# Patient Record
Sex: Female | Born: 1980 | Hispanic: Yes | Marital: Single | Smoking: Never smoker
Health system: Southern US, Community
[De-identification: ages and names within clinical notes are randomized; demographics above are authoritative.]

## PROBLEM LIST (undated history)

## (undated) DIAGNOSIS — Z789 Other specified health status: Secondary | ICD-10-CM

## (undated) HISTORY — PX: NO PAST SURGERIES: SHX2092

---

## 2005-01-28 ENCOUNTER — Inpatient Hospital Stay: Payer: Self-pay | Admitting: Obstetrics and Gynecology

## 2007-06-13 ENCOUNTER — Ambulatory Visit: Payer: Self-pay | Admitting: Certified Nurse Midwife

## 2008-09-02 ENCOUNTER — Inpatient Hospital Stay: Payer: Self-pay

## 2011-12-14 ENCOUNTER — Ambulatory Visit: Payer: Self-pay | Admitting: Family Medicine

## 2012-07-12 LAB — OB RESULTS CONSOLE RUBELLA ANTIBODY, IGM
RUBELLA: IMMUNE
Rubella: IMMUNE

## 2012-07-18 ENCOUNTER — Ambulatory Visit: Payer: Self-pay | Admitting: Primary Care

## 2012-08-14 ENCOUNTER — Emergency Department: Payer: Self-pay | Admitting: Internal Medicine

## 2012-11-27 ENCOUNTER — Inpatient Hospital Stay: Payer: Self-pay

## 2012-11-27 LAB — CBC WITH DIFFERENTIAL/PLATELET
Basophil %: 0.3 %
Eosinophil %: 0.3 %
HCT: 37.6 % (ref 35.0–47.0)
HGB: 12.9 g/dL (ref 12.0–16.0)
Lymphocyte #: 2.2 10*3/uL (ref 1.0–3.6)
MCHC: 34.2 g/dL (ref 32.0–36.0)
MCV: 85 fL (ref 80–100)
Neutrophil %: 71.6 %
Platelet: 252 10*3/uL (ref 150–440)
WBC: 10.6 10*3/uL (ref 3.6–11.0)

## 2012-11-28 LAB — HEMOGLOBIN: HGB: 12.1 g/dL (ref 12.0–16.0)

## 2013-03-23 IMAGING — US US OB US >=[ID] SNGL FETUS
1 series · 13 of 28 positions shown · non-contrast
Comparison: none

REASON FOR EXAM: anatomy  placenta location
COMMENTS:

[Series 1: us ob us >=(id) sngl fetus · 0.28mm/px · 13 of 57 slices shown]
[im 3/57]
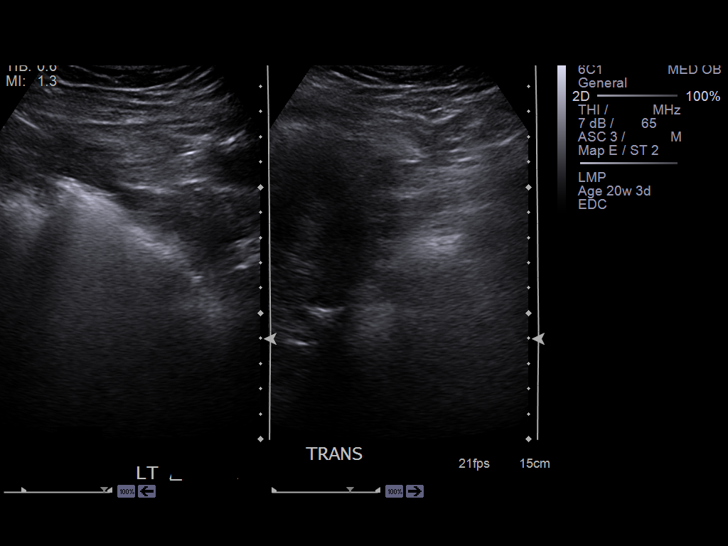
[im 7/57]
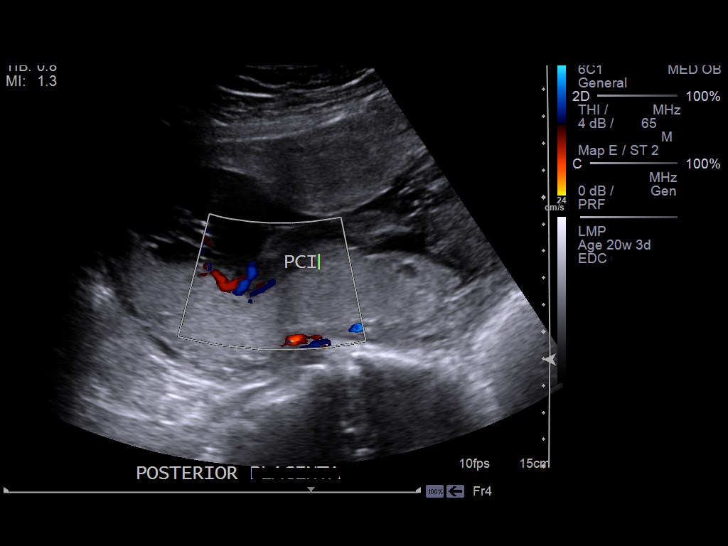
[im 11/57]
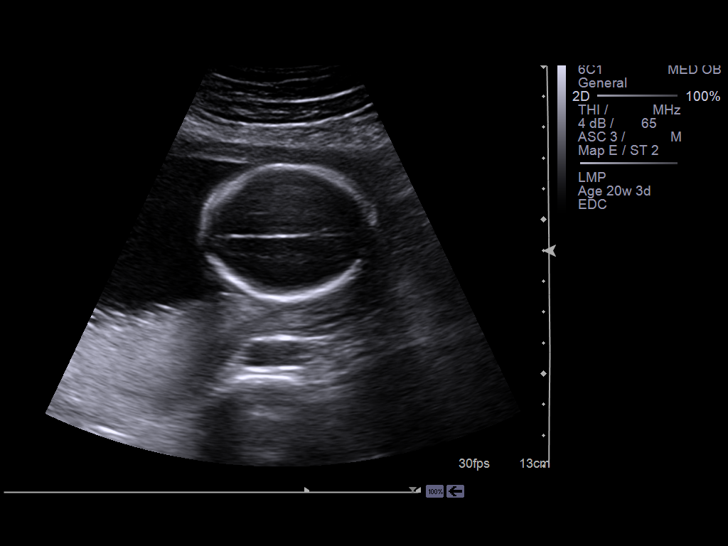
[im 15/57]
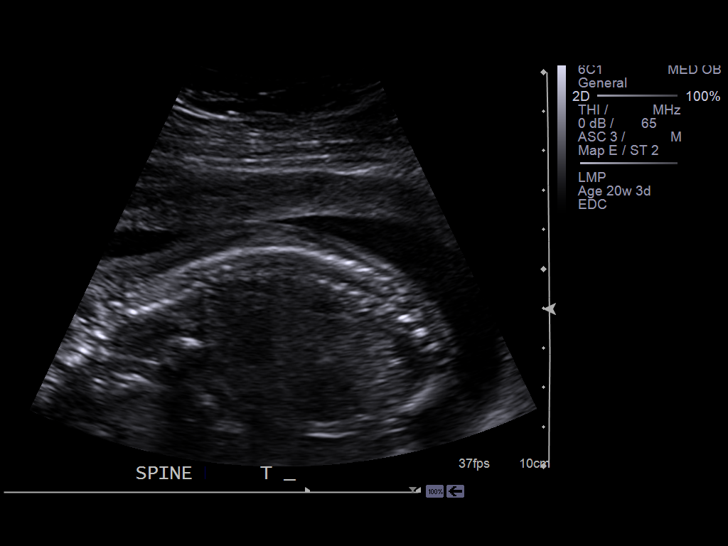
[im 19/57]
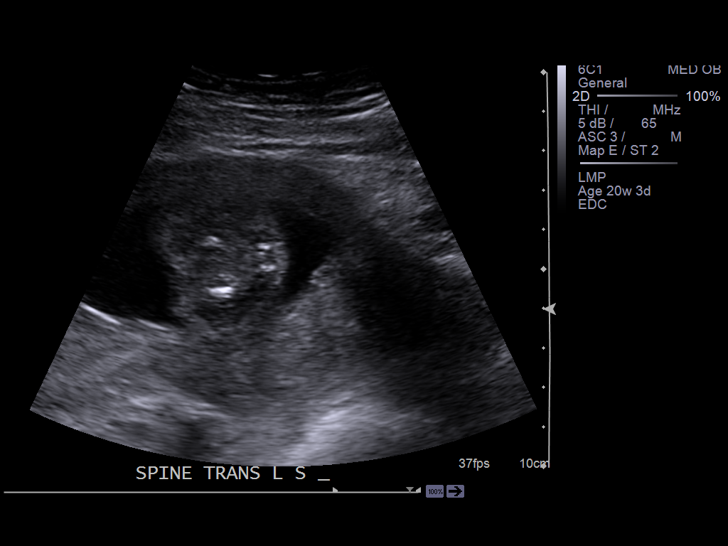
[im 23/57]
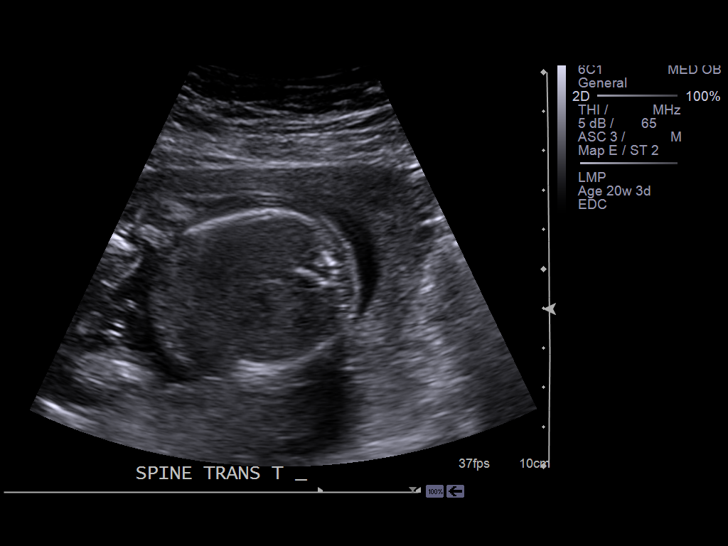
[im 30/57]
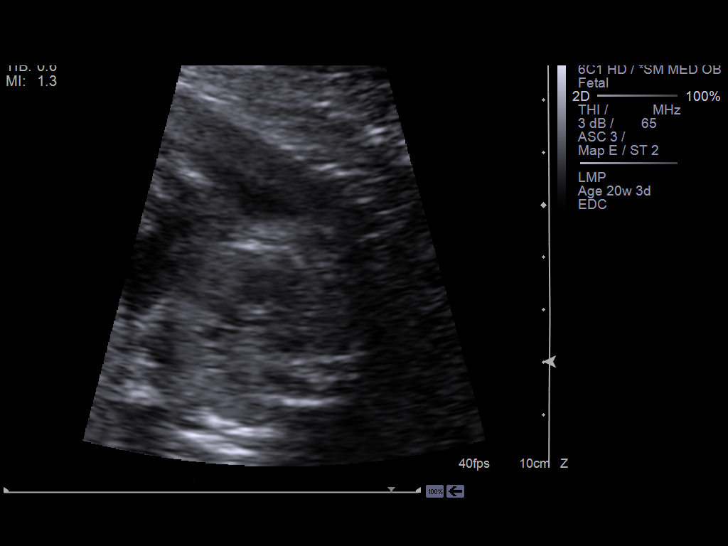
[im 34/57]
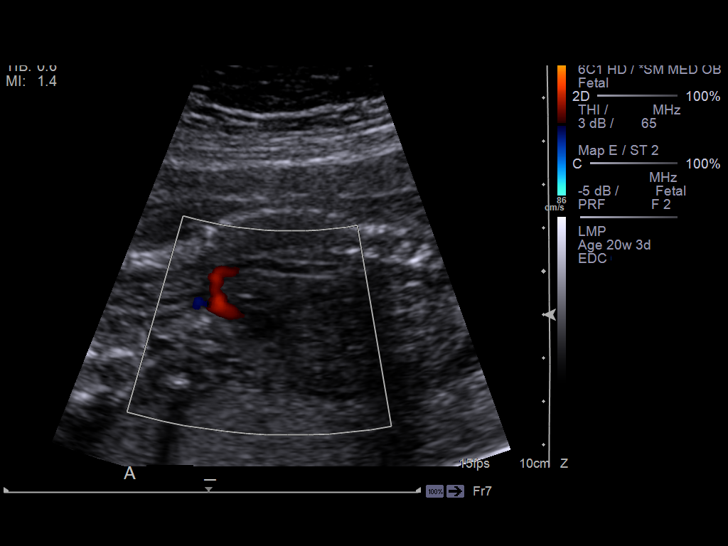
[im 38/57]
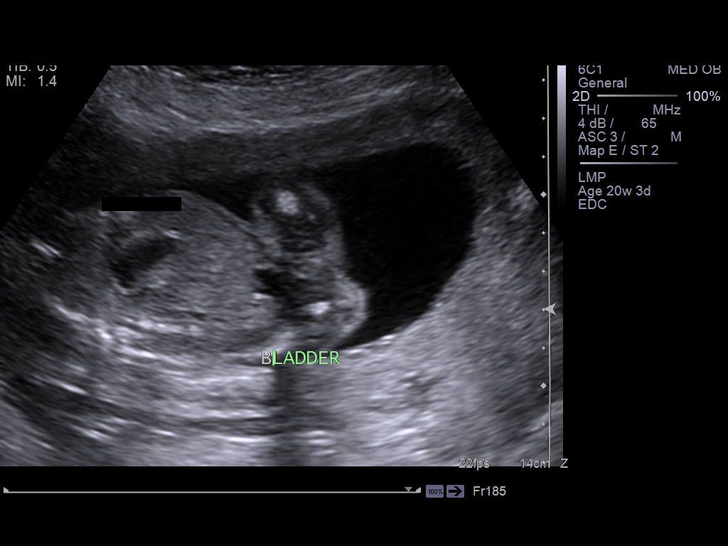
[im 42/57]
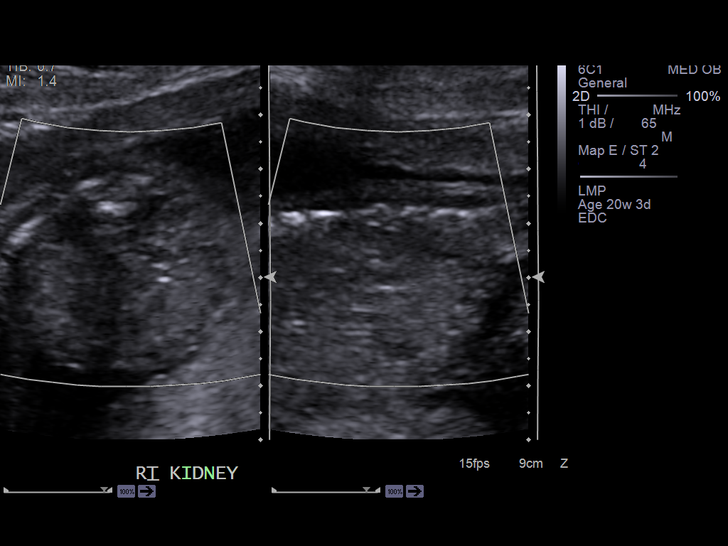
[im 46/57]
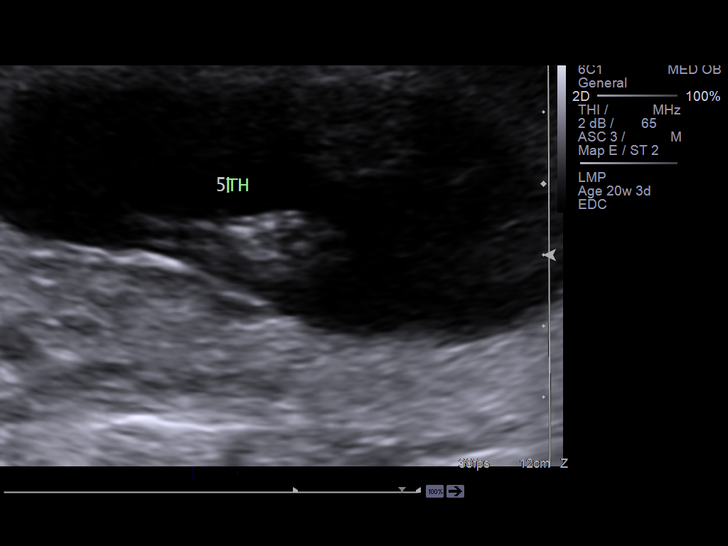
[im 50/57]
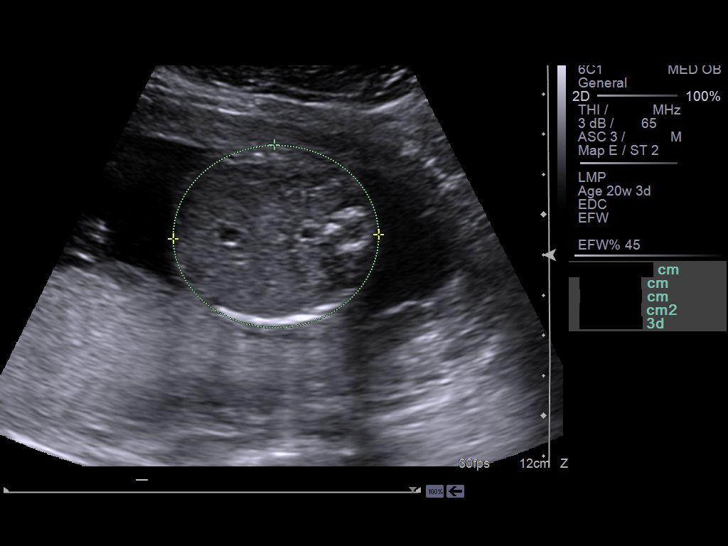
[im 54/57]
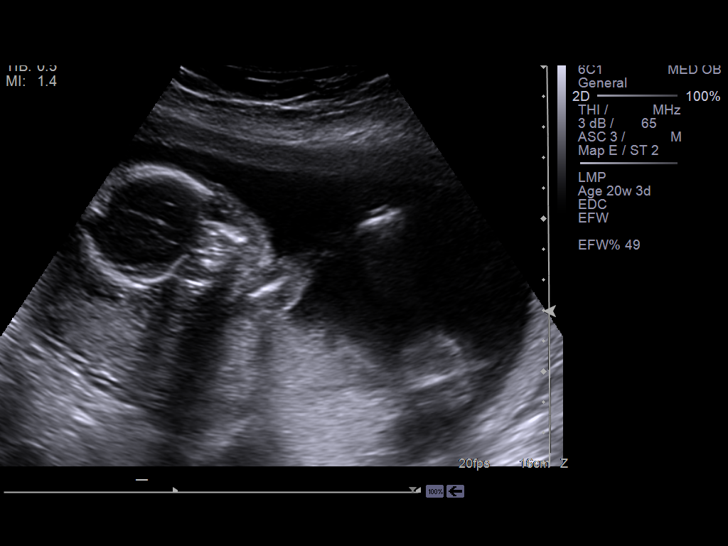

[13 of 28 positions shown; findings below may reference images not displayed]

PROCEDURE:     US  - US OB GREATER/OR EQUAL TO QCYT5  - July 18, 2012 [DATE]

RESULT:     There is a gravid uterus present. The presentation of the single
fetus is breech. The amniotic fluid volume is estimated to be normal. The
placenta is posterior, partially fundal, and the inferior margin of the
placenta is 4.6 cm from the internal cervical os.

The intracranial structures, the craniocervical junction, and the spinal
structures are normal in appearance. A 4 chambered heart with a rate of 165
beats per minute was demonstrated. The fetal stomach, kidneys, and urinary
bladder were demonstrated and appeared normal.

Measured parameters:
BPD 4.48 cm corresponding to an EGA of 19 weeks 4 days
HC 17.6 cm corresponding to an EGA of 20 weeks 0 days
AC. 15.05 cm corresponding to an EGA of 20 weeks 2 days
FL 3.47 cm corresponding to an EGA of 21 weeks 0 days
HL 3.36 cm corresponding to an EGA of 21 weeks 3 days
IMPRESSION: 1. There is a viable IUP with estimated gestational age of 20 weeks 3 days
+ / - 12 days. The estimated date of confinement is December 02, 2012. This is in
reasonable agreement with the patient's clinical dating. The estimated fetal
weight is 358 grams + / - 53 grams.
2. No fetal anomalies are demonstrated.
3. The placenta is posterior and partially fundal with no evidence of
placenta previa.

[REDACTED]

## 2016-03-09 ENCOUNTER — Other Ambulatory Visit: Payer: Self-pay | Admitting: Primary Care

## 2016-03-09 DIAGNOSIS — R102 Pelvic and perineal pain: Secondary | ICD-10-CM

## 2016-03-16 ENCOUNTER — Ambulatory Visit
Admission: RE | Admit: 2016-03-16 | Discharge: 2016-03-16 | Disposition: A | Discharge status: Home / Self Care | Payer: PRIVATE HEALTH INSURANCE | Source: Ambulatory Visit | Attending: Primary Care | Admitting: Primary Care

## 2016-03-16 ENCOUNTER — Ambulatory Visit (HOSPITAL_COMMUNITY): Payer: PRIVATE HEALTH INSURANCE

## 2016-03-16 DIAGNOSIS — R102 Pelvic and perineal pain: Secondary | ICD-10-CM

## 2016-03-17 ENCOUNTER — Ambulatory Visit: Admission: RE | Admit: 2016-03-17 | Payer: PRIVATE HEALTH INSURANCE | Source: Ambulatory Visit

## 2016-06-20 NOTE — L&D Delivery Note (Signed)
Delivery Note At 9:15 AM a  Viable female was delivered via  (Presentation: OA;  ). spont vag delivery, No CAN, Vtx, Ant and post shoulder and body del at   APGAR: , ; weight  .   Placenta status:Intact..  Cord:  with the following complications: None  Anesthesia: Epidural  Episiotomy:  None Lacerations: None  Suture Repair: none Est. Blood Loss (mL):  200 ml  Mom to .  Baby mom's abd. mom's abd. Sharee Pimple 02/15/2017, 9:26 AM

## 2016-09-22 ENCOUNTER — Other Ambulatory Visit: Payer: Self-pay | Admitting: Primary Care

## 2016-09-22 DIAGNOSIS — Z3482 Encounter for supervision of other normal pregnancy, second trimester: Secondary | ICD-10-CM

## 2016-09-23 LAB — OB RESULTS CONSOLE HIV ANTIBODY (ROUTINE TESTING): HIV: NONREACTIVE

## 2016-09-23 LAB — OB RESULTS CONSOLE HEPATITIS B SURFACE ANTIGEN: Hepatitis B Surface Ag: NEGATIVE

## 2016-09-23 LAB — OB RESULTS CONSOLE RPR: RPR: NONREACTIVE

## 2016-09-23 LAB — OB RESULTS CONSOLE VARICELLA ZOSTER ANTIBODY, IGG: Varicella: IMMUNE

## 2016-09-24 LAB — OB RESULTS CONSOLE GC/CHLAMYDIA
CHLAMYDIA, DNA PROBE: NEGATIVE
GC PROBE AMP, GENITAL: NEGATIVE

## 2016-09-29 ENCOUNTER — Ambulatory Visit
Admission: RE | Admit: 2016-09-29 | Discharge: 2016-09-29 | Disposition: A | Discharge status: Home / Self Care | Payer: PRIVATE HEALTH INSURANCE | Source: Ambulatory Visit | Attending: Primary Care | Admitting: Primary Care

## 2016-09-29 DIAGNOSIS — Z3482 Encounter for supervision of other normal pregnancy, second trimester: Secondary | ICD-10-CM | POA: Insufficient documentation

## 2016-11-01 ENCOUNTER — Other Ambulatory Visit: Payer: Self-pay | Admitting: Primary Care

## 2016-11-01 DIAGNOSIS — Z3482 Encounter for supervision of other normal pregnancy, second trimester: Secondary | ICD-10-CM

## 2017-01-19 LAB — OB RESULTS CONSOLE GBS: STREP GROUP B AG: NEGATIVE

## 2017-02-15 ENCOUNTER — Inpatient Hospital Stay
Admission: EM | Admit: 2017-02-15 | Discharge: 2017-02-16 | DRG: 775 | Disposition: A | Discharge status: Home / Self Care | Payer: Medicaid Other | Attending: Obstetrics and Gynecology | Admitting: Obstetrics and Gynecology

## 2017-02-15 ENCOUNTER — Inpatient Hospital Stay: Payer: Medicaid Other | Admitting: Registered Nurse

## 2017-02-15 DIAGNOSIS — Z3493 Encounter for supervision of normal pregnancy, unspecified, third trimester: Secondary | ICD-10-CM | POA: Diagnosis present

## 2017-02-15 DIAGNOSIS — Z3A39 39 weeks gestation of pregnancy: Secondary | ICD-10-CM | POA: Diagnosis not present

## 2017-02-15 DIAGNOSIS — Z3490 Encounter for supervision of normal pregnancy, unspecified, unspecified trimester: Secondary | ICD-10-CM

## 2017-02-15 DIAGNOSIS — M79661 Pain in right lower leg: Secondary | ICD-10-CM

## 2017-02-15 HISTORY — DX: Other specified health status: Z78.9

## 2017-02-15 LAB — TYPE AND SCREEN
ABO/RH(D): O POS
ANTIBODY SCREEN: NEGATIVE

## 2017-02-15 LAB — CBC
HCT: 39.9 % (ref 35.0–47.0)
Hemoglobin: 13.5 g/dL (ref 12.0–16.0)
MCH: 29.5 pg (ref 26.0–34.0)
MCHC: 33.7 g/dL (ref 32.0–36.0)
MCV: 87.6 fL (ref 80.0–100.0)
PLATELETS: 234 10*3/uL (ref 150–440)
RBC: 4.55 MIL/uL (ref 3.80–5.20)
RDW: 14.4 % (ref 11.5–14.5)
WBC: 11.5 10*3/uL — AB (ref 3.6–11.0)

## 2017-02-15 MED ORDER — ONDANSETRON HCL 4 MG PO TABS: 4.0000 mg | ORAL_TABLET | ORAL | Status: DC | PRN

## 2017-02-15 MED ORDER — LIDOCAINE HCL (PF) 2 % IJ SOLN
INTRAMUSCULAR | Status: DC | PRN
  Administered 2017-02-15: 100 mg via EPIDURAL

## 2017-02-15 MED ORDER — SODIUM CHLORIDE 0.9% FLUSH: 3.0000 mL | INTRAVENOUS | Status: DC | PRN

## 2017-02-15 MED ORDER — LACTATED RINGERS IV SOLN
INTRAVENOUS | Status: DC
  Administered 2017-02-15 (×2): via INTRAVENOUS

## 2017-02-15 MED ORDER — BUTORPHANOL TARTRATE 2 MG/ML IJ SOLN
1.0000 mg | INTRAMUSCULAR | Status: DC | PRN
  Administered 2017-02-15: 1 mg via INTRAVENOUS

## 2017-02-15 MED ORDER — OXYTOCIN 40 UNITS IN LACTATED RINGERS INFUSION - SIMPLE MED
INTRAVENOUS | Status: AC
  Administered 2017-02-15: 09:00:00
  Filled 2017-02-15: qty 1000

## 2017-02-15 MED ORDER — BUTORPHANOL TARTRATE 2 MG/ML IJ SOLN
INTRAMUSCULAR | Status: AC
  Administered 2017-02-15: 1 mg via INTRAVENOUS
  Filled 2017-02-15: qty 1

## 2017-02-15 MED ORDER — ACETAMINOPHEN 325 MG PO TABS
650.0000 mg | ORAL_TABLET | ORAL | Status: DC | PRN
  Administered 2017-02-15: 650 mg via ORAL
  Filled 2017-02-15: qty 2

## 2017-02-15 MED ORDER — SODIUM CHLORIDE 0.9% FLUSH: 3.0000 mL | Freq: Two times a day (BID) | INTRAVENOUS | Status: DC

## 2017-02-15 MED ORDER — LIDOCAINE HCL (PF) 1 % IJ SOLN
INTRAMUSCULAR | Status: AC
  Filled 2017-02-15: qty 30

## 2017-02-15 MED ORDER — TETANUS-DIPHTH-ACELL PERTUSSIS 5-2.5-18.5 LF-MCG/0.5 IM SUSP: 0.5000 mL | Freq: Once | INTRAMUSCULAR | Status: DC

## 2017-02-15 MED ORDER — BUTORPHANOL TARTRATE 2 MG/ML IJ SOLN: 1.0000 mg | INTRAMUSCULAR | Status: DC

## 2017-02-15 MED ORDER — BISACODYL 10 MG RE SUPP: 10.0000 mg | Freq: Every day | RECTAL | Status: DC | PRN

## 2017-02-15 MED ORDER — DIPHENHYDRAMINE HCL 25 MG PO CAPS: 25.0000 mg | ORAL_CAPSULE | Freq: Four times a day (QID) | ORAL | Status: DC | PRN

## 2017-02-15 MED ORDER — BUPIVACAINE HCL (PF) 0.25 % IJ SOLN
INTRAMUSCULAR | Status: DC | PRN
  Administered 2017-02-15: 4 mL via EPIDURAL

## 2017-02-15 MED ORDER — FLEET ENEMA 7-19 GM/118ML RE ENEM: 1.0000 | ENEMA | Freq: Every day | RECTAL | Status: DC | PRN

## 2017-02-15 MED ORDER — FENTANYL 2.5 MCG/ML W/ROPIVACAINE 0.15% IN NS 100 ML EPIDURAL (ARMC)
EPIDURAL | Status: AC
  Filled 2017-02-15: qty 100

## 2017-02-15 MED ORDER — ONDANSETRON HCL 4 MG/2ML IJ SOLN: 4.0000 mg | INTRAMUSCULAR | Status: DC | PRN

## 2017-02-15 MED ORDER — LACTATED RINGERS IV SOLN
500.0000 mL | INTRAVENOUS | Status: DC | PRN
  Administered 2017-02-15: 1000 mL via INTRAVENOUS

## 2017-02-15 MED ORDER — DIBUCAINE 1 % RE OINT: 1.0000 "application " | TOPICAL_OINTMENT | RECTAL | Status: DC | PRN

## 2017-02-15 MED ORDER — IBUPROFEN 600 MG PO TABS
600.0000 mg | ORAL_TABLET | Freq: Four times a day (QID) | ORAL | Status: DC
Start: 2017-02-15 — End: 2017-02-16
  Administered 2017-02-15 – 2017-02-16 (×5): 600 mg via ORAL
  Filled 2017-02-15 (×5): qty 1

## 2017-02-15 MED ORDER — COCONUT OIL OIL: 1.0000 "application " | TOPICAL_OIL | Status: DC | PRN

## 2017-02-15 MED ORDER — AMMONIA AROMATIC IN INHA
RESPIRATORY_TRACT | Status: AC
  Filled 2017-02-15: qty 10

## 2017-02-15 MED ORDER — BENZOCAINE-MENTHOL 20-0.5 % EX AERO: 1.0000 "application " | INHALATION_SPRAY | CUTANEOUS | Status: DC | PRN

## 2017-02-15 MED ORDER — SODIUM CHLORIDE 0.9 % IV SOLN: 250.0000 mL | INTRAVENOUS | Status: DC | PRN

## 2017-02-15 MED ORDER — LIDOCAINE HCL (PF) 1 % IJ SOLN
INTRAMUSCULAR | Status: DC | PRN
  Administered 2017-02-15: 3 mL via SUBCUTANEOUS

## 2017-02-15 MED ORDER — LIDOCAINE-EPINEPHRINE (PF) 1.5 %-1:200000 IJ SOLN
INTRAMUSCULAR | Status: DC | PRN
  Administered 2017-02-15: 3 mL via EPIDURAL

## 2017-02-15 MED ORDER — HYDROCODONE-ACETAMINOPHEN 5-325 MG PO TABS
1.0000 | ORAL_TABLET | ORAL | Status: DC | PRN
  Administered 2017-02-15: 2 via ORAL
  Administered 2017-02-16: 1 via ORAL
  Administered 2017-02-16: 2 via ORAL
  Administered 2017-02-16: 1 via ORAL
  Filled 2017-02-15 (×2): qty 1
  Filled 2017-02-15 (×2): qty 2

## 2017-02-15 MED ORDER — OXYTOCIN 10 UNIT/ML IJ SOLN
INTRAMUSCULAR | Status: AC
  Filled 2017-02-15: qty 2

## 2017-02-15 MED ORDER — MISOPROSTOL 200 MCG PO TABS
ORAL_TABLET | ORAL | Status: AC
  Filled 2017-02-15: qty 4

## 2017-02-15 MED ORDER — SENNOSIDES-DOCUSATE SODIUM 8.6-50 MG PO TABS
2.0000 | ORAL_TABLET | ORAL | Status: DC
  Administered 2017-02-15: 2 via ORAL
  Filled 2017-02-15: qty 2

## 2017-02-15 MED ORDER — ZOLPIDEM TARTRATE 5 MG PO TABS: 5.0000 mg | ORAL_TABLET | Freq: Every evening | ORAL | Status: DC | PRN

## 2017-02-15 MED ORDER — MEASLES, MUMPS & RUBELLA VAC ~~LOC~~ INJ
0.5000 mL | INJECTION | Freq: Once | SUBCUTANEOUS | Status: DC
  Filled 2017-02-15: qty 0.5

## 2017-02-15 MED ORDER — SIMETHICONE 80 MG PO CHEW
80.0000 mg | CHEWABLE_TABLET | ORAL | Status: DC | PRN
Start: 2017-02-15 — End: 2017-02-16

## 2017-02-15 MED ORDER — PRENATAL MULTIVITAMIN CH
1.0000 | ORAL_TABLET | Freq: Every day | ORAL | Status: DC
  Administered 2017-02-15 – 2017-02-16 (×2): 1 via ORAL
  Filled 2017-02-15 (×3): qty 1

## 2017-02-15 MED ORDER — FENTANYL 2.5 MCG/ML W/ROPIVACAINE 0.15% IN NS 100 ML EPIDURAL (ARMC)
EPIDURAL | Status: DC | PRN
  Administered 2017-02-15: 12 mL/h via EPIDURAL

## 2017-02-15 MED ORDER — WITCH HAZEL-GLYCERIN EX PADS: 1.0000 "application " | MEDICATED_PAD | CUTANEOUS | Status: DC | PRN

## 2017-02-15 NOTE — Anesthesia Preprocedure Evaluation (Signed)
Anesthesia Evaluation  Patient identified by MRN, date of birth, ID band Patient awake    Reviewed: Allergy & Precautions, H&P , NPO status , Patient's Chart, lab work & pertinent test results  Airway Mallampati: IV  TM Distance: >3 FB Neck ROM: full    Dental  (+) Teeth Intact, Dental Advidsory Given   Pulmonary neg pulmonary ROS,           Cardiovascular Exercise Tolerance: Good negative cardio ROS       Neuro/Psych negative neurological ROS  negative psych ROS   GI/Hepatic negative GI ROS, Neg liver ROS,   Endo/Other  negative endocrine ROS  Renal/GU negative Renal ROS  negative genitourinary   Musculoskeletal   Abdominal   Peds  Hematology negative hematology ROS (+)   Anesthesia Other Findings   Reproductive/Obstetrics (+) Pregnancy                             Anesthesia Physical Anesthesia Plan  ASA: II  Anesthesia Plan: Epidural   Post-op Pain Management:    Induction:   PONV Risk Score and Plan:   Airway Management Planned:   Additional Equipment:   Intra-op Plan:   Post-operative Plan:   Informed Consent: I have reviewed the patients History and Physical, chart, labs and discussed the procedure including the risks, benefits and alternatives for the proposed anesthesia with the patient or authorized representative who has indicated his/her understanding and acceptance.     Plan Discussed with: Anesthesiologist and CRNA  Anesthesia Plan Comments:         Anesthesia Quick Evaluation

## 2017-02-15 NOTE — OB Triage Note (Signed)
Patient came in for observation for labor evaluation. Patient reports contractions every four to five minutes that started last night around 2200. Patient denies leaking of fluid but reports vaginal bleeding and spotting when patient went to the bathroom around 0500. Vital signs stable and patient afebrile. FHR baseline 155 with moderate variability with accelerations 15 x 15 and no decelerations. Significant other at bedside. Will continue to monitor.

## 2017-02-15 NOTE — Discharge Instructions (Signed)
Care After Vaginal Delivery °Congratulations on your new baby!! ° °Refer to this sheet in the next few weeks. These discharge instructions provide you with information on caring for yourself after delivery. Your caregiver may also give you specific instructions. Your treatment has been planned according to the most current medical practices available, but problems sometimes occur. Call your caregiver if you have any problems or questions after you go home. ° °HOME CARE INSTRUCTIONS °· Take over-the-counter or prescription medicines only as directed by your caregiver or pharmacist. °· Do not drink alcohol, especially if you are breastfeeding or taking medicine to relieve pain. °· Do not chew or smoke tobacco. °· Do not use illegal drugs. °· Continue to use good perineal care. Good perineal care includes: °¨ Wiping your perineum from front to back. °¨ Keeping your perineum clean. °· Do not use tampons or douche until your caregiver says it is okay. °· Shower, wash your hair, and take tub baths as directed by your caregiver. °· Wear a well-fitting bra that provides breast support. °· Eat healthy foods. °· Drink enough fluids to keep your urine clear or pale yellow. °· Eat high-fiber foods such as whole grain cereals and breads, brown rice, beans, and fresh fruits and vegetables every day. These foods may help prevent or relieve constipation. °· Follow your caregiver's recommendations regarding resumption of activities such as climbing stairs, driving, lifting, exercising, or traveling. Specifically, no driving for two weeks, so that you are comfortable reacting quickly in an emergency. °· Talk to your caregiver about resuming sexual activities. Resumption of sexual activities is dependent upon your risk of infection, your rate of healing, and your comfort and desire to resume sexual activity. Usually we recommend waiting about six weeks, or until your bleeding stops and you are interested in sex. °· Try to have someone  help you with your household activities and your newborn for at least a few days after you leave the hospital. Even longer is better. °· Rest as much as possible. Try to rest or take a nap when your newborn is sleeping. Sleep deprivation can be very hard after delivery. °· Increase your activities gradually. °· Keep all of your scheduled postpartum appointments. It is very important to keep your scheduled follow-up appointments. At these appointments, your caregiver will be checking to make sure that you are healing physically and emotionally. ° °SEEK MEDICAL CARE IF:  °· You are passing large clots from your vagina.  °· You have a foul smelling discharge from your vagina. °· You have trouble urinating. °· You are urinating frequently. °· You have pain when you urinate. °· You have a change in your bowel movements. °· You have increasing redness, pain, or swelling near your vaginal incision (episiotomy) or vaginal tear. °· You have pus draining from your episiotomy or vaginal tear. °· Your episiotomy or vaginal tear is separating. °· You have painful, hard, or reddened breasts. °· You have a severe headache. °· You have blurred vision or see spots. °· You feel sad or depressed. °· You have thoughts of hurting yourself or your newborn. °· You have questions about your care, the care of your newborn, or medicines. °· You are dizzy or light-headed. °· You have a rash. °· You have nausea or vomiting. °· You were breastfeeding and have not had a menstrual period within 12 weeks after you stopped breastfeeding. °· You are not breastfeeding and have not had a menstrual period by the 12th week after delivery. °· You   have a fever. ° °SEEK IMMEDIATE MEDICAL CARE IF:  °· You have persistent pain. °· You have chest pain. °· You have shortness of breath. °· You faint. °· You have leg pain. °· You have stomach pain. °· Your vaginal bleeding saturates two or more sanitary pads in 1 hour. ° °MAKE SURE YOU:  °· Understand these  instructions. °· Will get help right away if you are not doing well or get worse. °·  °Document Released: 06/03/2000 Document Revised: 10/21/2013 Document Reviewed: 02/01/2012 ° °ExitCare® Patient Information ©2015 [employer]. This information is not intended to replace advice given to you by your health care provider. Make sure you discuss any questions you have with your health care provider. ° °

## 2017-02-15 NOTE — H&P (Signed)
Tara Fletcher is a 36 y.o. female presenting for active labor pattern since last pm. Pt goes to [facility] and is a [id number] who has had 4 NSVD's in the past. Pt has an LMP of 07/04/16 & EDD of 02/22/17 and [place] at 24 2/7 weeks with EDD of 02/21/17. GBs neg. Pt admitted to [facility] for delivery. NO ROM, no VB or decreased FM Today. Pt is hurting and epidural just performed prior to my arrival. Report from Dr Bernestine Amass.  OB History    Gravida Para Term Preterm AB Living   2 1 1     1    SAB TAB Ectopic Multiple Live Births           1     OEV:OJJK pain, lower back pain, HPylori, Varicella non-immune,  Past Surgical History:  Procedure Laterality Date  . NO PAST SURGERIES    FMH:Dad:HTN, MGM:DM Social History   Social History  . Marital status: Single    Spouse name: N/A  . Number of children: N/A  . Years of education: N/A   Occupational History  . Not on file.   Social History Main Topics  . Smoking status: Never Smoker  . Smokeless tobacco: Never Used  . Alcohol use Not on file  . Drug use: Unknown  . Sexual activity: Not on file   Other Topics Concern  . Not on file   Social History Narrative  . No narrative on file  OB:GYN: [id number], 1 SAB, 4 NSVD 1. 04/20/98  40 wks  NSVD of viable female 8pounds in [place].  2. 07/21/01   40 wks   NSVD of viable F 9 pounds in [facility] 3. 01/28/05  37 wks? NSVD of viable F, 4 pounds in [facility] 4. 06/13/07  SAB at 6 weeks 5. 09/02/08  40 wks  NSVD of viable Female at 6 pounds at [facility]    Maternal Diabetes:137 Genetic Screening: declined Maternal Ultrasounds/Referrals:WNL Fetal Ultrasounds or other Referrals: N/A Maternal Substance Abuse:  None Significant Maternal Medications: PNV Significant Maternal Lab Results: Varicella non-immune, GBS neg  Other Comments:  ROS History Dilation: 9 Effacement (%): 90 Station: -2 Exam by:: LSE Blood pressure 130/73, pulse 79, temperature 98.1 F (36.7 C), temperature source Oral, height 5\' 2"  (1.575 m), weight  80.3 kg (177 lb), SpO2 98 %. Exam Physical Exam  Gen:35 yo Hispanic female with active labor HEENT:Eyes non-icteric, Normocephalic Lungs: CTA bilat, no W/R/R. Heart: S1S2, RRR, NO M/R/G KXF:GHWEXH, EFW 7#8oz See cx exam Extrems: Rt 2+/0 Prenatal labs: ABO, Rh:  O pos Antibody:  Neg Rubella:  Immune RPR:   NR HBsAg:   Neg HIV:   NR GBS:   Neg  Assessment/Plan: A:1. IUP at 39 weeks 2. AMA  3. Multip 4. GBS neg 5. Varicella non-immune P:1. Pt just received epidural. 2. Antic SVD. 3. Will give Varivax pp. 4. Plans PP IUD. 5. Plans to breast/bottle.   Sharee Pimple 02/15/2017, 8:11 AM

## 2017-02-15 NOTE — Discharge Summary (Addendum)
Obstetric Discharge Summary Reason for Admission: Term pregnancy in active labor  Prenatal Procedures: [place] Intrapartum Procedures: Ext fetal and uterine monitors  Postpartum Procedures: Doppler of right leg Complications-Operative and Postpartum: None - Homans sign positive on right leg postpartum day 1. Dopplers negative for DVT - Mild fundal tenderness in am PPD#1, resolved by evening prior to discharge Hemoglobin  Date Value Ref Range Status  02/16/2017 11.5 (L) 12.0 - 16.0 g/dL Final   HGB  Date Value Ref Range Status  11/28/2012 12.1 12.0 - 16.0 g/dL Final   HCT  Date Value Ref Range Status  02/16/2017 33.0 (L) 35.0 - 47.0 % Final  11/27/2012 37.6 35.0 - 47.0 % Final    Physical Exam:  General:a,A,&O x3 CV:S1S2, RRR, NO M/R/G Lungs:CTA bilat, no W/R/R Lochia:  Mod, no clots Uterine Fundus: FF,  Incision:None   Discharge Diagnoses: Term delivery with intact perineum   Discharge Information: Date: 02/16/2017 Activity: No driving x 2 weeks, No sex x 6 weeks Diet: Regular Medications: PNV, FE, Ibuprofen  Condition: Stable  Instructions: FU at 6 weeks pp at [facility] Discharge to: Home    Newborn Data: Live born female  Birth Weight: 7 lb 7.2 oz (3380 g) APGAR: 8, 9  Home with Mom.   Christeen Douglas 02/16/2017, 2:41 PM

## 2017-02-15 NOTE — Anesthesia Procedure Notes (Signed)
Epidural Patient location during procedure: OB Start time: 02/15/2017 7:52 AM End time: 02/15/2017 8:00 AM  Staffing Anesthesiologist: Naomie Dean Resident/CRNA: Karoline Caldwell Performed: resident/CRNA   Preanesthetic Checklist Completed: patient identified, site marked, surgical consent, pre-op evaluation, timeout performed, IV checked, risks and benefits discussed and monitors and equipment checked  Epidural Patient position: sitting Prep: Betadine Patient monitoring: heart rate, continuous pulse ox and blood pressure Approach: midline Location: L4-L5 Injection technique: LOR saline  Needle:  Needle type: Tuohy  Needle gauge: 17 G Needle length: 9 cm and 9 Needle insertion depth: 6 cm Catheter type: closed end flexible Catheter size: 19 Gauge Catheter at skin depth: 10 cm Test dose: negative and 1.5% lidocaine with Epi 1:200 K  Assessment Events: blood not aspirated, injection not painful, no injection resistance, negative IV test and no paresthesia  Additional Notes Pt. Evaluated and documentation done after procedure finished. Patient identified. Risks/Benefits/Options discussed with patient including but not limited to bleeding, infection, nerve damage, paralysis, failed block, incomplete pain control, headache, blood pressure changes, nausea, vomiting, reactions to medication both or allergic, itching and postpartum back pain. Confirmed with bedside nurse the patient's most recent platelet count. Confirmed with patient that they are not currently taking any anticoagulation, have any bleeding history or any family history of bleeding disorders. Patient expressed understanding and wished to proceed. All questions were answered. Sterile technique was used throughout the entire procedure. Please see nursing notes for vital signs. Test dose was given through epidural catheter and negative prior to continuing to dose epidural or start infusion. Warning signs of high block given  to the patient including shortness of breath, tingling/numbness in hands, complete motor block, or any concerning symptoms with instructions to call for help. Patient was given instructions on fall risk and not to get out of bed. All questions and concerns addressed with instructions to call with any issues or inadequate analgesia.   Patient tolerated the insertion well without immediate complications.Reason for block:procedure for pain

## 2017-02-16 ENCOUNTER — Inpatient Hospital Stay: Payer: Medicaid Other

## 2017-02-16 LAB — CBC
HEMATOCRIT: 33 % — AB (ref 35.0–47.0)
Hemoglobin: 11.5 g/dL — ABNORMAL LOW (ref 12.0–16.0)
MCH: 29.9 pg (ref 26.0–34.0)
MCHC: 34.7 g/dL (ref 32.0–36.0)
MCV: 86.2 fL (ref 80.0–100.0)
Platelets: 198 10*3/uL (ref 150–440)
RBC: 3.83 MIL/uL (ref 3.80–5.20)
RDW: 14.3 % (ref 11.5–14.5)
WBC: 10.5 10*3/uL (ref 3.6–11.0)

## 2017-02-16 LAB — RPR: RPR: NONREACTIVE

## 2017-02-16 MED ORDER — HYDROCODONE-ACETAMINOPHEN 5-325 MG PO TABS: 1.0000 | ORAL_TABLET | ORAL | 0 refills | Status: AC | PRN

## 2017-02-16 MED ORDER — IBUPROFEN 600 MG PO TABS: 600.0000 mg | ORAL_TABLET | Freq: Four times a day (QID) | ORAL | 0 refills | Status: AC

## 2017-02-16 NOTE — Progress Notes (Signed)
Post Partum Day 1 Subjective: Abdominal pain and right calf tenderness  Objective: Blood pressure (!) 99/57, pulse 64, temperature 98.5 F (36.9 C), temperature source Oral, resp. rate 18, height 5\' 2"  (1.575 m), weight 177 lb (80.3 kg), SpO2 99 %, unknown if currently breastfeeding.  Physical Exam:  General: alert, cooperative and appears stated age Lochia: appropriate Uterine Fundus: firm and tender DVT Evaluation: No cords or calf tenderness. No significant calf/ankle edema. Positive Homan's sign.   Recent Labs  02/15/17 0620 02/16/17 0504  HGB 13.5 11.5*  HCT 39.9 33.0*    Assessment/Plan: - right calf doppler - monitor fundal tenderness - encourage ambulation and lactation consult\ - repeat wbc if any other s/s endometritis    LOS: 1 day   Christeen DouglasBethany Marianne Golightly 02/16/2017, 9:19 AM

## 2017-02-16 NOTE — Progress Notes (Signed)
Discharge instructions reviewed with patient.  All questions answered.

## 2017-02-16 NOTE — Lactation Note (Signed)
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 Feeding Type: Breast Fed   Interventions    Lactation Tools Discussed/Used WIC Program: Yes   Consult Status      Trudee GripCarolyn P Halsey Hammen 02/16/2017, 12:40 PM

## 2017-02-16 NOTE — Progress Notes (Signed)
Face to face (0930am) with Dr.Beasley regarding positive Homans on right side during assessment.  Per Dr.Beasely will order Doppler to rule out DVT before sending pt home.

## 2017-02-16 NOTE — Anesthesia Postprocedure Evaluation (Signed)
Anesthesia Post Note  Patient: [name]  Procedure(s) Performed: * No procedures listed *  Patient location during evaluation: Mother Baby Anesthesia Type: Epidural Level of consciousness: awake and alert and oriented Pain management: satisfactory to patient Vital Signs Assessment: post-procedure vital signs reviewed and stable Respiratory status: respiratory function stable Cardiovascular status: stable Postop Assessment: no headache, no backache, epidural receding, no signs of nausea or vomiting, patient able to bend at knees and adequate PO intake Anesthetic complications: no     Last Vitals:  Vitals:   02/16/17 0028 02/16/17 0358  BP: 99/69 96/61  Pulse: (!) 58 64  Resp: 20 20  Temp: 36.6 C 36.7 C  SpO2: 99% 98%    Last Pain:  Vitals:   02/16/17 0500  TempSrc:   PainSc: 2                  [name], [name]

## 2017-02-16 NOTE — Progress Notes (Signed)
Paged Dr.Beasley to notify about US of lower right leg results.  No new orders at this time.  Dr.Beasley to discharge pt to home soon

## 2017-06-04 IMAGING — US US OB COMP +14 WK
1 of 2 series · 13 of 28 positions shown · non-contrast
Comparison: none

CLINICAL DATA: 35-year-old pregnant female presenting for fetal
dating and anatomic survey.

EXAM:
OBSTETRICAL ULTRASOUND >14 WKS

[Series 1: us ob comp +14 wk · 0.23mm/px · 119 acquisitions, 13 frames shown]
[im 5/119]
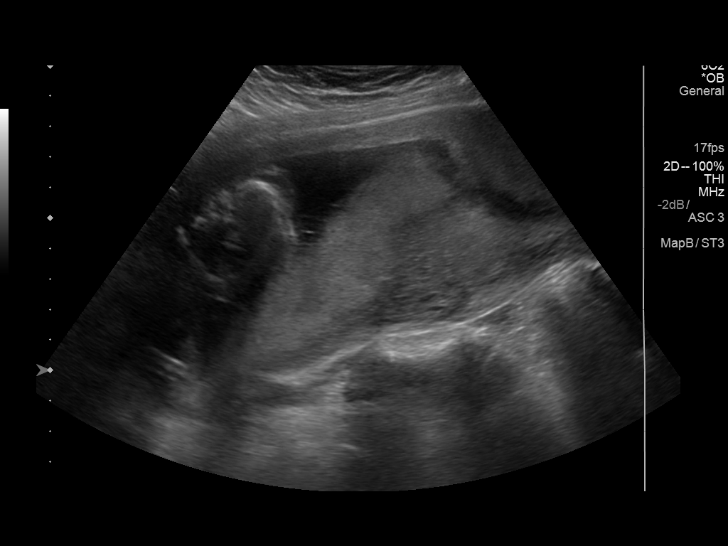
[im 14/119]
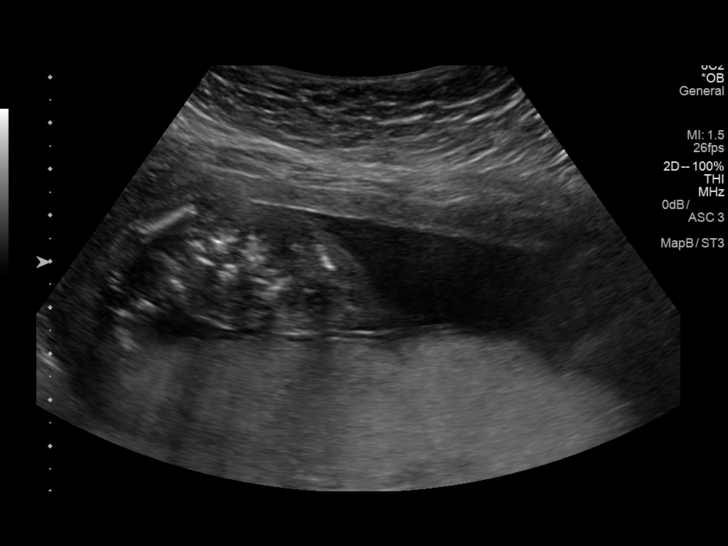
[im 23/119]
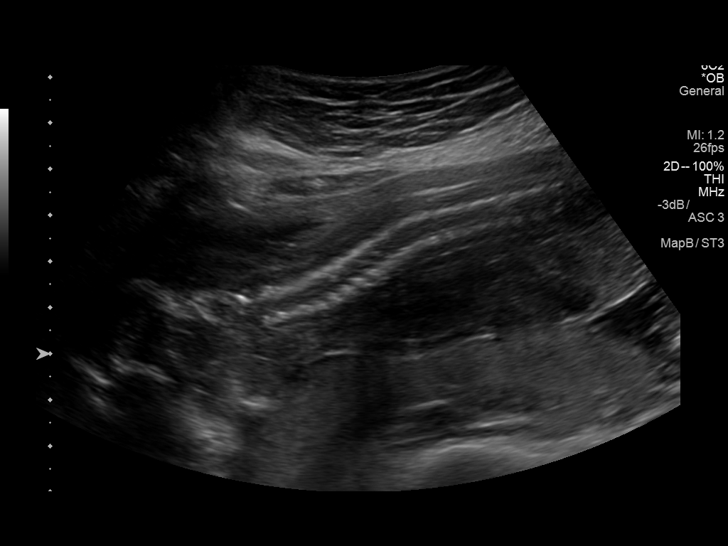
[im 32/119]
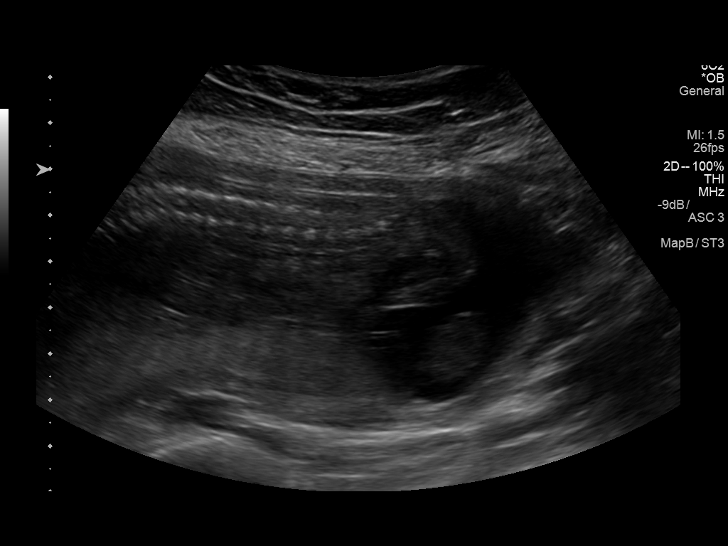
[im 41/119]
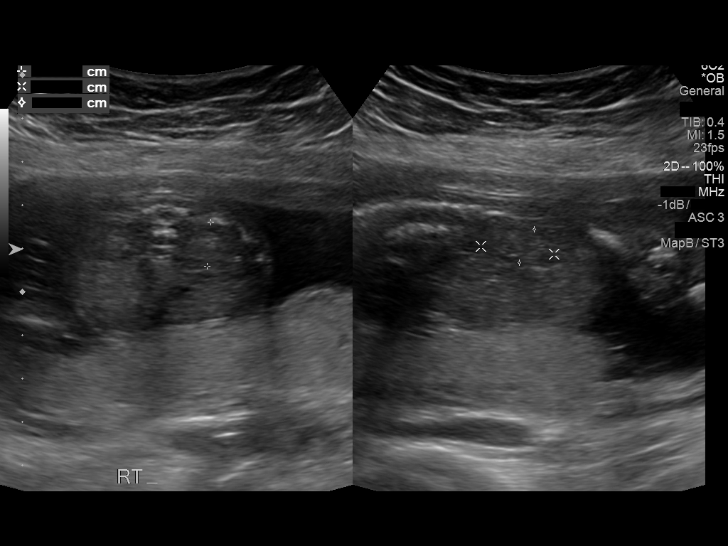
[im 50/119]
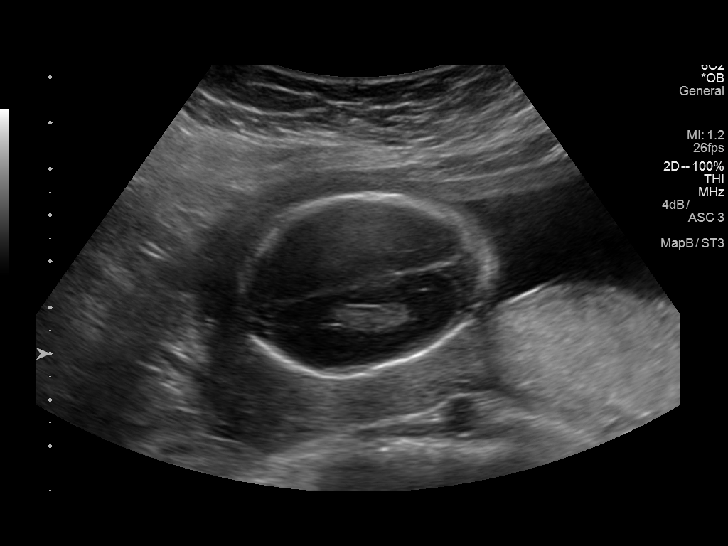
[im 64/119]
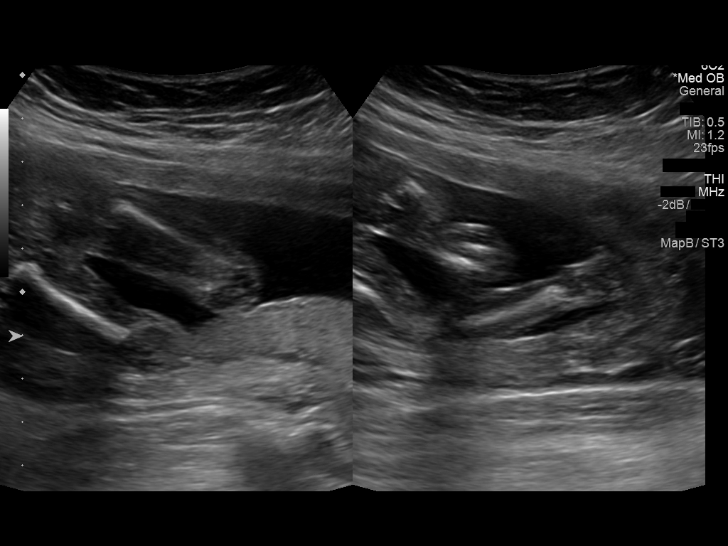
[im 73/119]
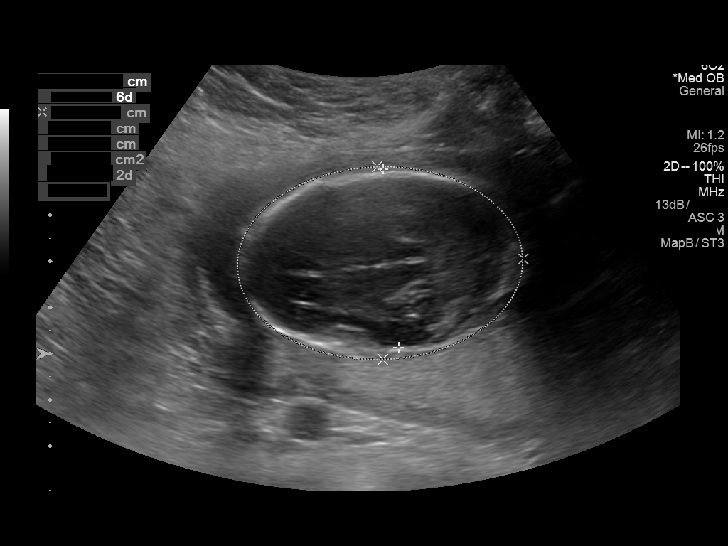
[im 82/119]
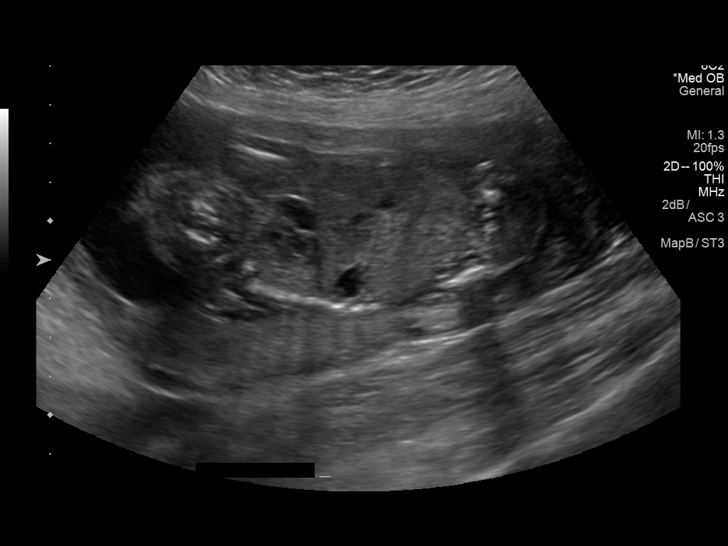
[im 91/119]
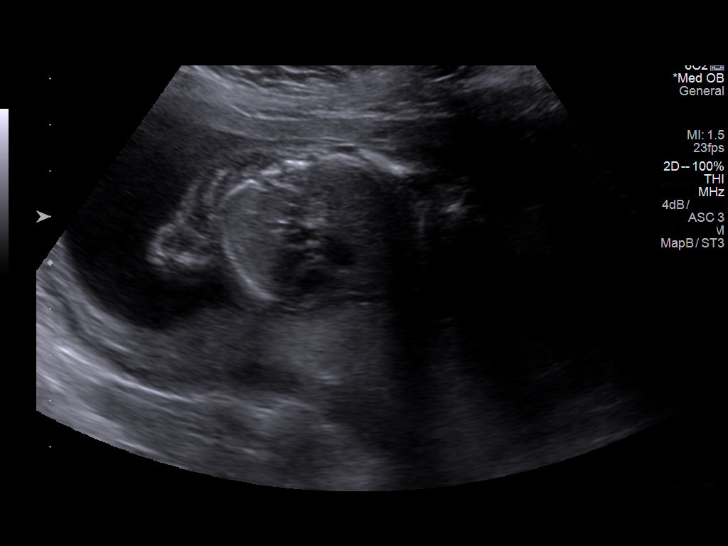
[im 100/119]
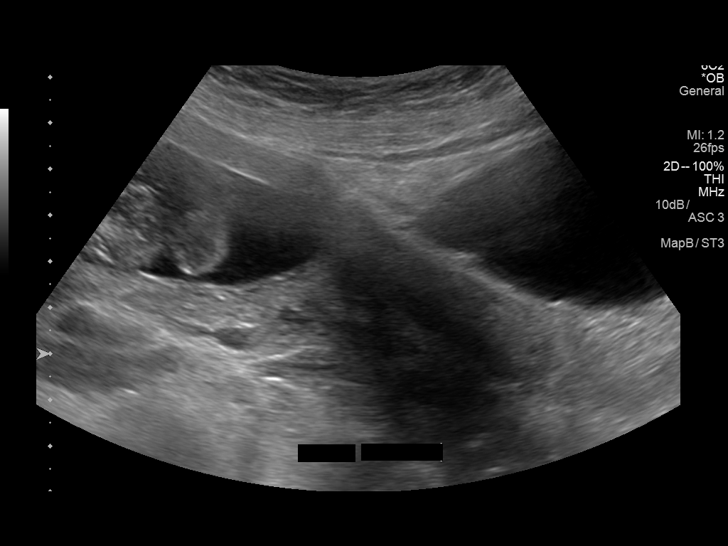
[im 109/119]
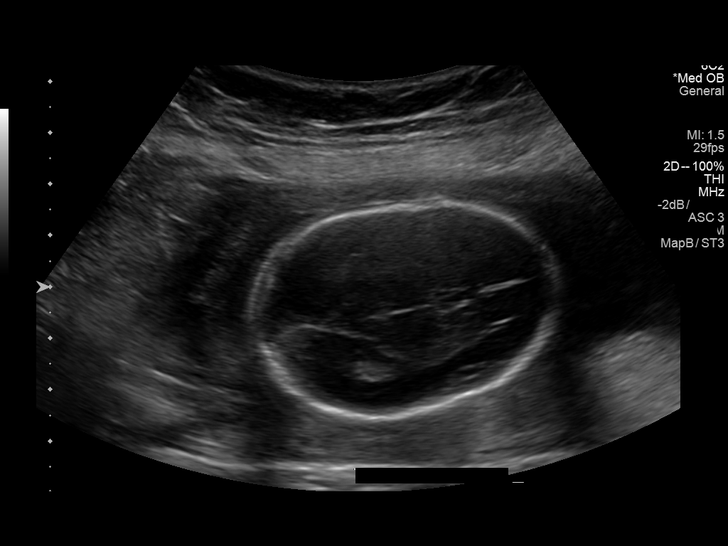
[im 119/119]
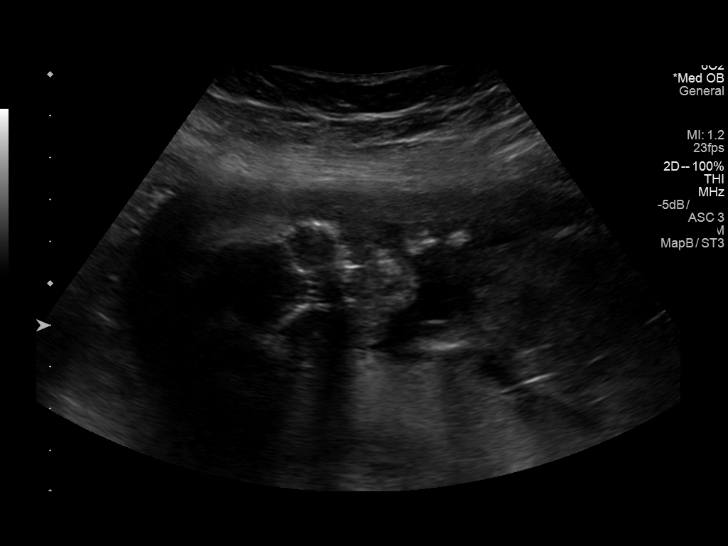

[13 of 28 positions shown; findings below may reference images not displayed]

FINDINGS: Number of Fetuses: 1

Heart Rate:  158 bpm

Movement: Yes

Presentation: Variable

Previa: No

Placental Location: Posterior

Amniotic Fluid (Subjective): Normal

Amniotic Fluid (Objective):

Vertical pocket 4.0cm

FETAL BIOMETRY

BPD:  3.9cm 17w 6d

HC:    16.5cm  19w   2d

AC:   13.3cm  18w   6d

FL:   3.1cm  19w   5d

Current Mean GA: 19w 1d              US EDC: 02/22/2017

Estimated Fetal Weight:  280g

FETAL ANATOMY

Lateral Ventricles: Appears normal

Thalami/CSP: Appears normal

Posterior Fossa:  Appears normal

Nuchal Region: Appears normal    NFT= 4.3mm

Upper Lip: Appears normal

Spine: Appears normal

4 Chamber Heart on Left: Appears normal

LVOT: Appears normal

RVOT: Appears normal

Stomach on Left: Appears normal

3 Vessel Cord: Appears normal

Cord Insertion site: Appears normal

Kidneys: Appears normal

Bladder: Appears normal

Extremities: Appears normal

Sex: Female external genitalia.

Technically difficult due to: Fetal position.

Maternal Findings:

Cervix: Normal cervix length 3.3 cm on transabdominal scan, with no
evidence of internal cervical funneling.
IMPRESSION: 1. Single living intrauterine gestation at 19 weeks 1 day by average
ultrasound age.
2. No fetal or maternal abnormalities detected.

## 2017-10-22 IMAGING — US US EXTREM LOW VENOUS*R*
1 series · 13 of 24 positions shown · non-contrast
Comparison: None.

CLINICAL DATA: Right calf pain for 2 days. Patient is postpartum as
of yesterday.



[Series 1: us extrem low venous*right* · 0.07mm/px · 13 of 33 slices shown]
[im 1/33]
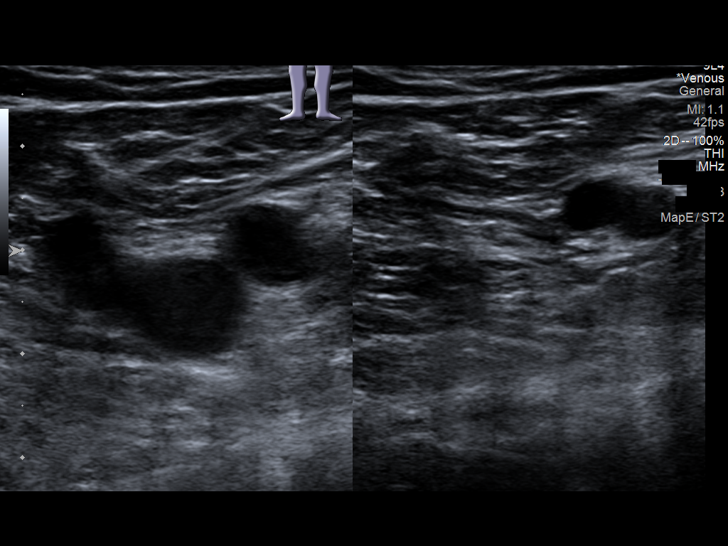
[im 3/33]
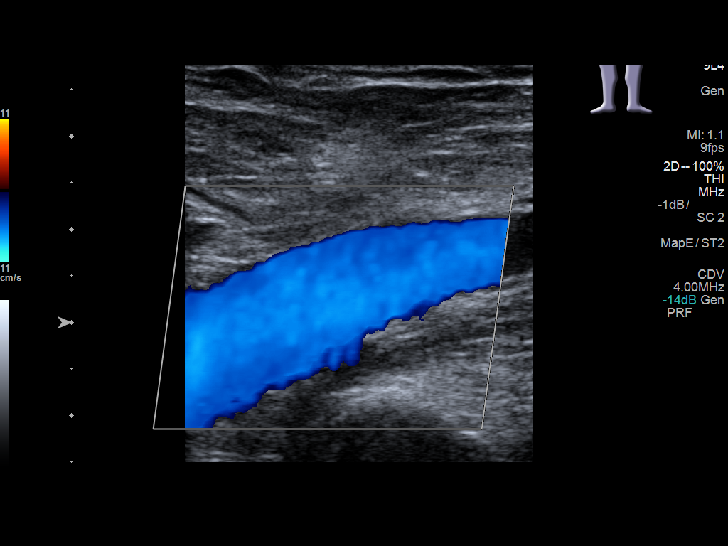
[im 6/33]
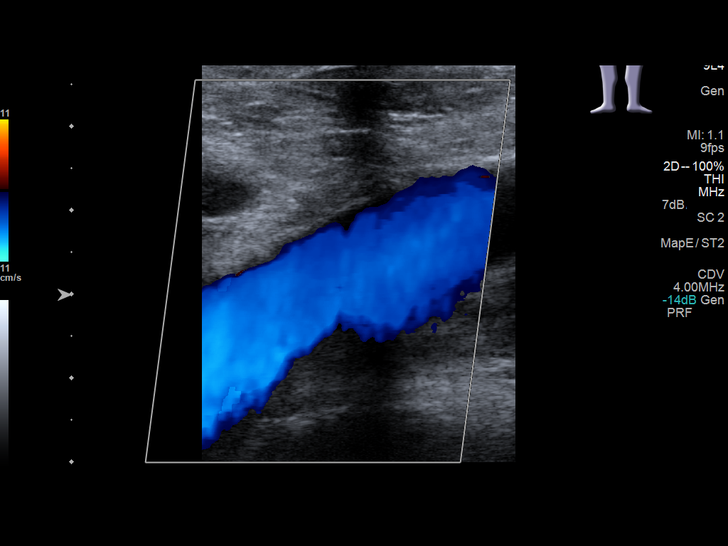
[im 9/33]
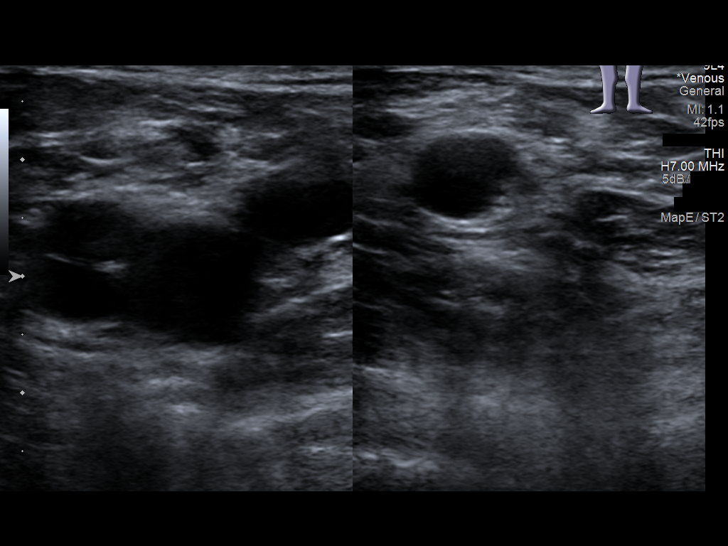
[im 12/33]
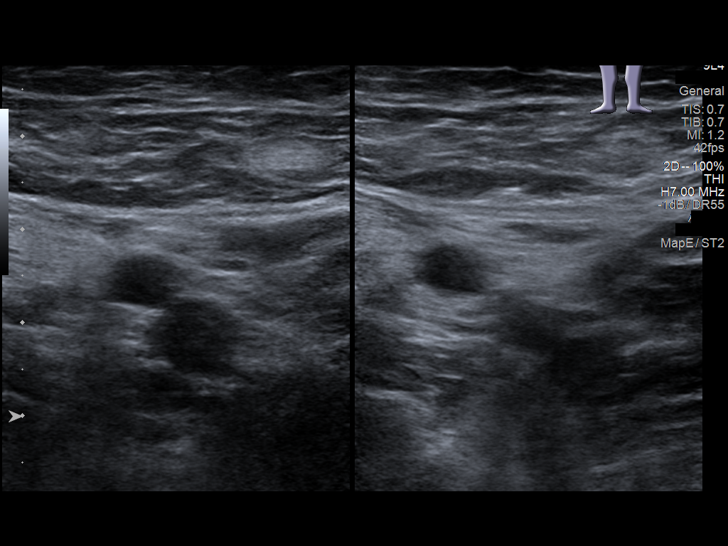
[im 14/33]
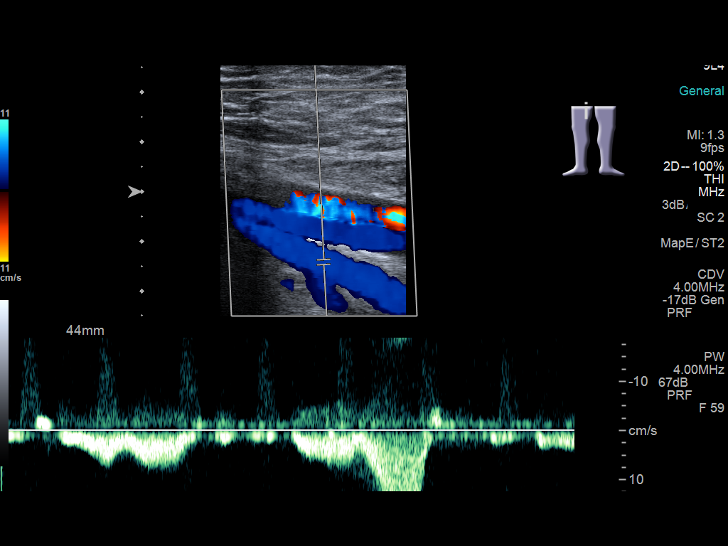
[im 17/33]
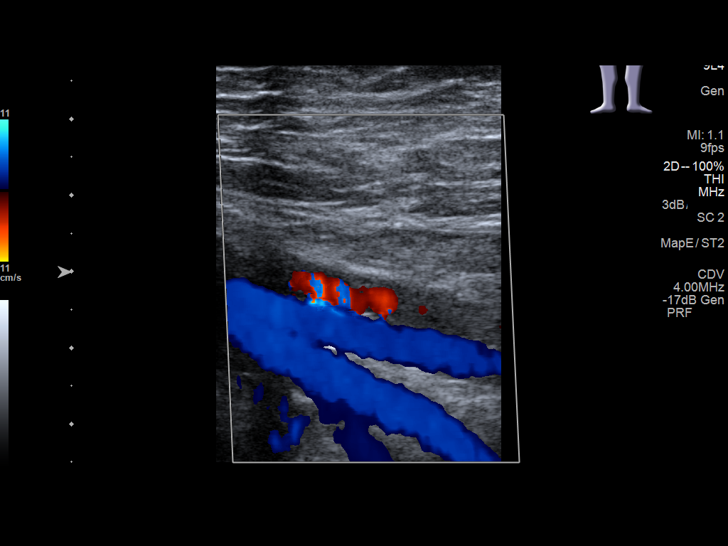
[im 19/33]
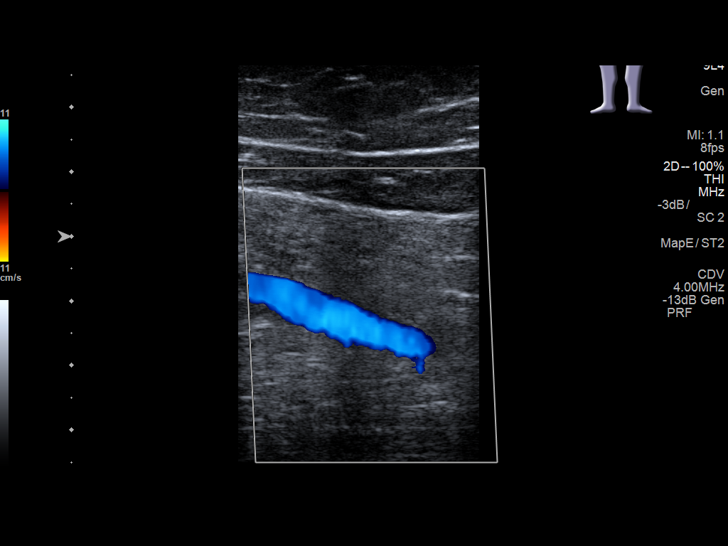
[im 21/33]
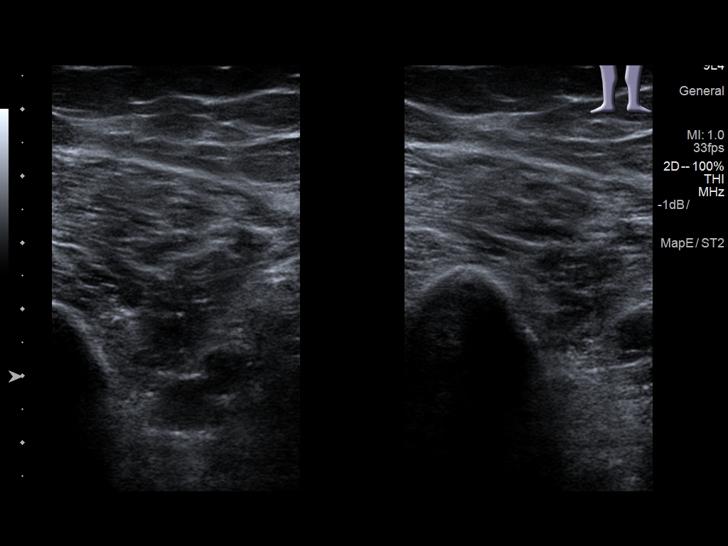
[im 24/33]
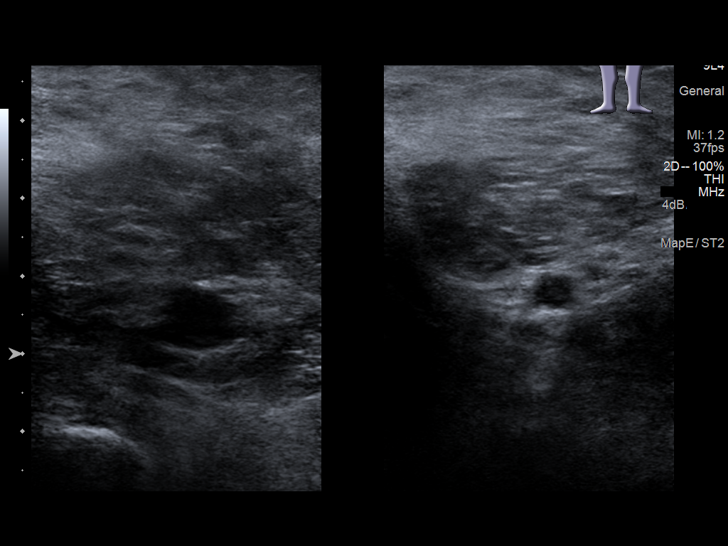
[im 27/33]
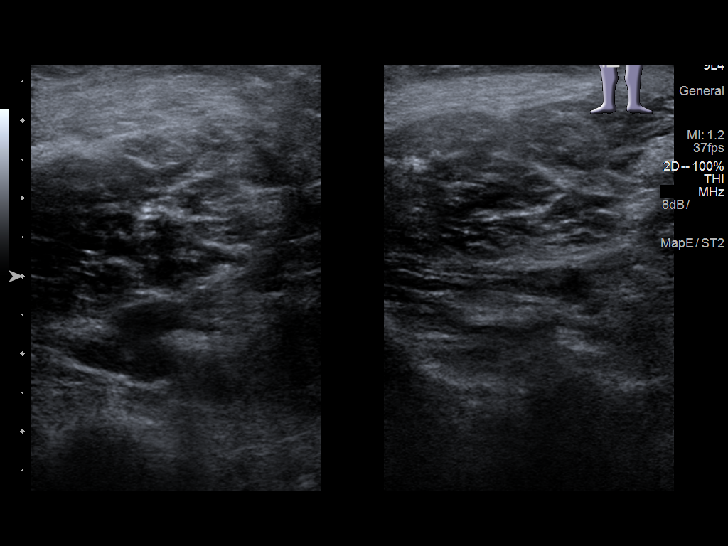
[im 30/33]
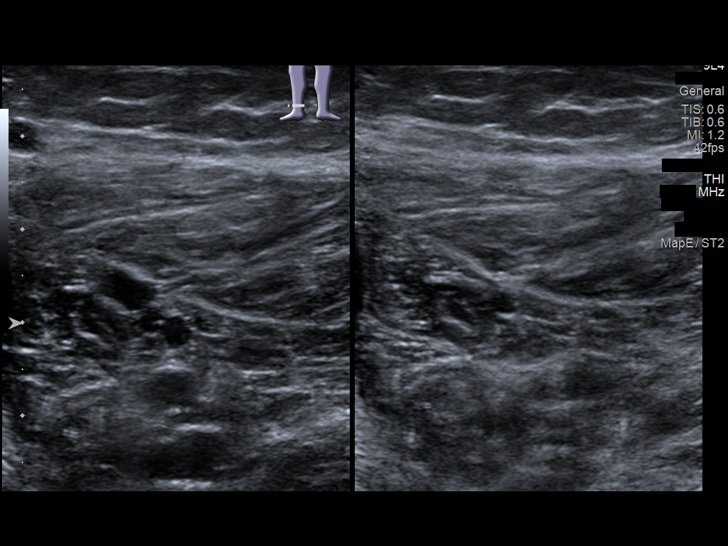
[im 33/33]
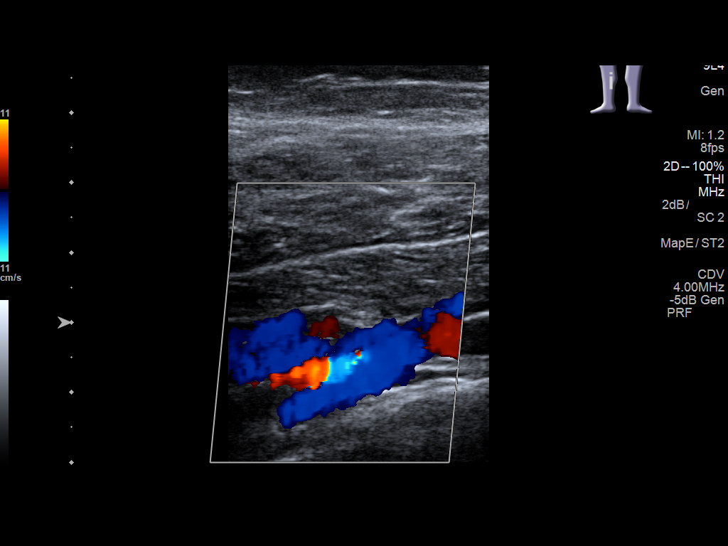

[13 of 24 positions shown; findings below may reference images not displayed]

FINDINGS: Contralateral Common Femoral Vein: Respiratory phasicity is normal
and symmetric with the symptomatic side. No evidence of thrombus.
Normal compressibility.

Common Femoral Vein: No evidence of thrombus. Normal
compressibility, respiratory phasicity and response to augmentation.

Saphenofemoral Junction: No evidence of thrombus. Normal
compressibility and flow on color Doppler imaging.

Profunda Femoral Vein: No evidence of thrombus. Normal
compressibility and flow on color Doppler imaging.

Femoral Vein: No evidence of thrombus. Normal compressibility,
respiratory phasicity and response to augmentation.

Popliteal Vein: No evidence of thrombus. Normal compressibility,
respiratory phasicity and response to augmentation.

Calf Veins: No evidence of thrombus. Normal compressibility and flow
on color Doppler imaging.

Superficial Great Saphenous Vein: No evidence of thrombus. Normal
compressibility and flow on color Doppler imaging.

Venous Reflux:  None.

Other Findings:  None.
IMPRESSION: No evidence of DVT within the right lower extremity.

## 2021-09-20 ENCOUNTER — Other Ambulatory Visit: Payer: Self-pay

## 2021-09-20 DIAGNOSIS — Z1231 Encounter for screening mammogram for malignant neoplasm of breast: Secondary | ICD-10-CM

## 2021-09-22 ENCOUNTER — Ambulatory Visit: Payer: MEDICAID | Attending: Hematology and Oncology

## 2021-09-22 ENCOUNTER — Inpatient Hospital Stay: Admission: RE | Admit: 2021-09-22 | Payer: Self-pay | Source: Ambulatory Visit

## 2023-08-18 ENCOUNTER — Encounter: Payer: Self-pay | Admitting: Primary Care

## 2023-09-12 ENCOUNTER — Other Ambulatory Visit: Payer: Self-pay

## 2023-09-12 DIAGNOSIS — Z1231 Encounter for screening mammogram for malignant neoplasm of breast: Secondary | ICD-10-CM

## 2023-10-23 ENCOUNTER — Telehealth: Payer: Self-pay | Admitting: *Deleted

## 2023-10-23 ENCOUNTER — Ambulatory Visit: Payer: MEDICAID
